# Patient Record
Sex: Female | Born: 1995
Health system: Southern US, Community
[De-identification: ages and names within clinical notes are randomized; demographics above are authoritative.]

---
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
## 2019-06-14 ENCOUNTER — Ambulatory Visit: Payer: BC Managed Care – PPO | Admitting: Women's Health

## 2019-06-14 ENCOUNTER — Encounter: Payer: Self-pay | Admitting: Women's Health

## 2019-06-14 ENCOUNTER — Other Ambulatory Visit: Payer: Self-pay

## 2019-06-14 VITALS — BP 110/78 | Ht 66.0 in | Wt 124.0 lb

## 2019-06-14 DIAGNOSIS — Z01419 Encounter for gynecological examination (general) (routine) without abnormal findings: Secondary | ICD-10-CM | POA: Diagnosis not present

## 2019-06-14 DIAGNOSIS — Z113 Encounter for screening for infections with a predominantly sexual mode of transmission: Secondary | ICD-10-CM | POA: Diagnosis not present

## 2019-06-14 MED ORDER — DROSPIRENONE-ETHINYL ESTRADIOL 3-0.02 MG PO TABS: 1.0000 | ORAL_TABLET | Freq: Every day | ORAL | 4 refills | Status: DC

## 2019-06-14 NOTE — Progress Notes (Signed)
Mallory Foster 02/16/1996 [id number]    History:    Presents for annual exam.  Monthly cycle on Yaz without complaint.  Currently not sexually active new partner in the past year.  Reports normal Pap history.  Thinks she has completed Gardasil unsure.  Past medical history, past surgical history, family history and social history were all reviewed and documented in the EPIC chart.  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  Father diabetes, mother epilepsy.  ROS:  A ROS was performed and pertinent positives and negatives are included.  Exam:  Vitals:   06/14/19 1618  BP: 110/78  Weight: 124 lb (56.2 kg)  Height: 5\' 6"  (1.676 m)   Body mass index is 20.01 kg/m.   General appearance:  Normal Thyroid:  Symmetrical, normal in size, without palpable masses or nodularity. Respiratory  Auscultation:  Clear without wheezing or rhonchi Cardiovascular  Auscultation:  Regular rate, without rubs, murmurs or gallops  Edema/varicosities:  Not grossly evident Abdominal  Soft,nontender, without masses, guarding or rebound.  Liver/spleen:  No organomegaly noted  Hernia:  None appreciated  Skin  Inspection:  Grossly normal   Breasts: Examined lying and sitting.     Right: Without masses, retractions, discharge or axillary adenopathy.     Left: Without masses, retractions, discharge or axillary adenopathy. Gentitourinary   Inguinal/mons:  Normal without inguinal adenopathy  External genitalia:  Normal  BUS/Urethra/Skene's glands:  Normal  Vagina:  Normal  Cervix:  Normal  Uterus:  normal in size, shape and contour.  Midline and mobile  Adnexa/parametria:     Rt: Without masses or tenderness.   Lt: Without masses or tenderness.  Anus and perineum: Normal    Assessment/Plan:  23 y.o. S WF G0 for annual exam with no complaints.  Monthly cycle on Yaz STD screen  Plan: Contraception management reviewed, contemplating Mirena IUD, information given, reviewed slight risk for  infection, perforation and hemorrhage reviewed.  Will check coverage and schedule appointment with Dr. Dellis Filbert to have placed with a cycle.  Refill of Yaz given, reviewed slight risk for blood clots and strokes.  Condoms encouraged until permanent partner.  SBEs, exercise, calcium rich foods, MVI daily encouraged.  CBC, GC/chlamydia, HIV, RPR, Pap. Will check with pediatrician if did not complete Gardasil series will call or return to office to complete series.    [place], 4:33 PM 06/14/2019

## 2019-06-14 NOTE — Patient Instructions (Addendum)
It was nice meeting you today!  Health Maintenance, Female Adopting a healthy lifestyle and getting preventive care are important in promoting health and wellness. Ask your health care provider about:  The right schedule for you to have regular tests and exams.  Things you can do on your own to prevent diseases and keep yourself healthy. What should I know about diet, weight, and exercise? Eat a healthy diet   Eat a diet that includes plenty of vegetables, fruits, low-fat dairy products, and lean protein.  Do not eat a lot of foods that are high in solid fats, added sugars, or sodium. Maintain a healthy weight Body mass index (BMI) is used to identify weight problems. It estimates body fat based on height and weight. Your health care provider can help determine your BMI and help you achieve or maintain a healthy weight. Get regular exercise Get regular exercise. This is one of the most important things you can do for your health. Most adults should:  Exercise for at least 150 minutes each week. The exercise should increase your heart rate and make you sweat (moderate-intensity exercise).  Do strengthening exercises at least twice a week. This is in addition to the moderate-intensity exercise.  Spend less time sitting. Even light physical activity can be beneficial. Watch cholesterol and blood lipids Have your blood tested for lipids and cholesterol at 24 years of age, then have this test every 5 years. Have your cholesterol levels checked more often if:  Your lipid or cholesterol levels are high.  You are older than 24 years of age.  You are at high risk for heart disease. What should I know about cancer screening? Depending on your health history and family history, you may need to have cancer screening at various ages. This may include screening for:  Breast cancer.  Cervical cancer.  Colorectal cancer.  Skin cancer.  Lung cancer. What should I know about heart  disease, diabetes, and high blood pressure? Blood pressure and heart disease  High blood pressure causes heart disease and increases the risk of stroke. This is more likely to develop in people who have high blood pressure readings, are of African descent, or are overweight.  Have your blood pressure checked: ? Every 3-5 years if you are 3318-24 years of age. ? Every year if you are 24 years old or older. Diabetes Have regular diabetes screenings. This checks your fasting blood sugar level. Have the screening done:  Once every three years after age 24 if you are at a normal weight and have a low risk for diabetes.  More often and at a younger age if you are overweight or have a high risk for diabetes. What should I know about preventing infection? Hepatitis B If you have a higher risk for hepatitis B, you should be screened for this virus. Talk with your health care provider to find out if you are at risk for hepatitis B infection. Hepatitis C Testing is recommended for:  Everyone born from [birth year] through 1966.  Anyone with known risk factors for hepatitis C. Sexually transmitted infections (STIs)  Get screened for STIs, including gonorrhea and chlamydia, if: ? You are sexually active and are younger than 24 years of age. ? You are older than 24 years of age and your health care provider tells you that you are at risk for this type of infection. ? Your sexual activity has changed since you were last screened, and you are at increased risk for chlamydia or  gonorrhea. Ask your health care provider if you are at risk.  Ask your health care provider about whether you are at high risk for HIV. Your health care provider may recommend a prescription medicine to help prevent HIV infection. If you choose to take medicine to prevent HIV, you should first get tested for HIV. You should then be tested every 3 months for as long as you are taking the medicine. Pregnancy  If you are about to stop  having your period (premenopausal) and you may become pregnant, seek counseling before you get pregnant.  Take 400 to 800 micrograms (mcg) of folic acid every day if you become pregnant.  Ask for birth control (contraception) if you want to prevent pregnancy. Osteoporosis and menopause Osteoporosis is a disease in which the bones lose minerals and strength with aging. This can result in bone fractures. If you are 29 years old or older, or if you are at risk for osteoporosis and fractures, ask your health care provider if you should:  Be screened for bone loss.  Take a calcium or vitamin D supplement to lower your risk of fractures.  Be given hormone replacement therapy (HRT) to treat symptoms of menopause. Follow these instructions at home: Lifestyle  Do not use any products that contain nicotine or tobacco, such as cigarettes, e-cigarettes, and chewing tobacco. If you need help quitting, ask your health care provider.  Do not use street drugs.  Do not share needles.  Ask your health care provider for help if you need support or information about quitting drugs. Alcohol use  Do not drink alcohol if: ? Your health care provider tells you not to drink. ? You are pregnant, may be pregnant, or are planning to become pregnant.  If you drink alcohol: ? Limit how much you use to 0-1 drink a day. ? Limit intake if you are breastfeeding.  Be aware of how much alcohol is in your drink. In the [place] one drink equals one 12 oz bottle of beer (355 mL), one 5 oz glass of wine (148 mL), or one 1 oz glass of hard liquor (44 mL). General instructions  Schedule regular health, dental, and eye exams.  Stay current with your vaccines.  Tell your health care provider if: ? You often feel depressed. ? You have ever been abused or do not feel safe at home. Summary  Adopting a healthy lifestyle and getting preventive care are important in promoting health and wellness.  Follow your health  care provider's instructions about healthy diet, exercising, and getting tested or screened for diseases.  Follow your health care provider's instructions on monitoring your cholesterol and blood pressure. This information is not intended to replace advice given to you by your health care provider. Make sure you discuss any questions you have with your health care provider. Document Released: 02/09/2011 Document Revised: 07/20/2018 Document Reviewed: 07/20/2018 Elsevier Patient Education  2020 [employer]. Levonorgestrel intrauterine device (IUD) What is this medicine? LEVONORGESTREL IUD (LEE voe nor jes trel) is a contraceptive (birth control) device. The device is placed inside the uterus by a healthcare professional. It is used to prevent pregnancy. This device can also be used to treat heavy bleeding that occurs during your period. This medicine may be used for other purposes; ask your health care provider or pharmacist if you have questions. COMMON BRAND NAME(S): Cameron Ali What should I tell my health care provider before I take this medicine? They need to know if you have  any of these conditions:  abnormal Pap smear  cancer of the breast, uterus, or cervix  diabetes  endometritis  genital or pelvic infection now or in the past  have more than one sexual partner or your partner has more than one partner  heart disease  history of an ectopic or tubal pregnancy  immune system problems  IUD in place  liver disease or tumor  problems with blood clots or take blood-thinners  seizures  use intravenous drugs  uterus of unusual shape  vaginal bleeding that has not been explained  an unusual or allergic reaction to levonorgestrel, other hormones, silicone, or polyethylene, medicines, foods, dyes, or preservatives  pregnant or trying to get pregnant  breast-feeding How should I use this medicine? This device is placed inside the uterus by a  health care professional. Talk to your pediatrician regarding the use of this medicine in children. Special care may be needed. Overdosage: If you think you have taken too much of this medicine contact a poison control center or emergency room at once. NOTE: This medicine is only for you. Do not share this medicine with others. What if I miss a dose? This does not apply. Depending on the brand of device you have inserted, the device will need to be replaced every 3 to 6 years if you wish to continue using this type of birth control. What may interact with this medicine? Do not take this medicine with any of the following medications:  amprenavir  bosentan  fosamprenavir This medicine may also interact with the following medications:  aprepitant  armodafinil  barbiturate medicines for inducing sleep or treating seizures  bexarotene  boceprevir  griseofulvin  medicines to treat seizures like carbamazepine, ethotoin, felbamate, oxcarbazepine, phenytoin, topiramate  modafinil  pioglitazone  rifabutin  rifampin  rifapentine  some medicines to treat HIV infection like atazanavir, efavirenz, indinavir, lopinavir, nelfinavir, tipranavir, ritonavir  St. John's wort  warfarin This list may not describe all possible interactions. Give your health care provider a list of all the medicines, herbs, non-prescription drugs, or dietary supplements you use. Also tell them if you smoke, drink alcohol, or use illegal drugs. Some items may interact with your medicine. What should I watch for while using this medicine? Visit your doctor or health care professional for regular check ups. See your doctor if you or your partner has sexual contact with others, becomes HIV positive, or gets a sexual transmitted disease. This product does not protect you against HIV infection (AIDS) or other sexually transmitted diseases. You can check the placement of the IUD yourself by reaching up to the top  of your vagina with clean fingers to feel the threads. Do not pull on the threads. It is a good habit to check placement after each menstrual period. Call your doctor right away if you feel more of the IUD than just the threads or if you cannot feel the threads at all. The IUD may come out by itself. You may become pregnant if the device comes out. If you notice that the IUD has come out use a backup birth control method like condoms and call your health care provider. Using tampons will not change the position of the IUD and are okay to use during your period. This IUD can be safely scanned with magnetic resonance imaging (MRI) only under specific conditions. Before you have an MRI, tell your healthcare provider that you have an IUD in place, and which type of IUD you have in place.  What side effects may I notice from receiving this medicine? Side effects that you should report to your doctor or health care professional as soon as possible:  allergic reactions like skin rash, itching or hives, swelling of the face, lips, or tongue  fever, flu-like symptoms  genital sores  high blood pressure  no menstrual period for 6 weeks during use  pain, swelling, warmth in the leg  pelvic pain or tenderness  severe or sudden headache  signs of pregnancy  stomach cramping  sudden shortness of breath  trouble with balance, talking, or walking  unusual vaginal bleeding, discharge  yellowing of the eyes or skin Side effects that usually do not require medical attention (report to your doctor or health care professional if they continue or are bothersome):  acne  breast pain  change in sex drive or performance  changes in weight  cramping, dizziness, or faintness while the device is being inserted  headache  irregular menstrual bleeding within first 3 to 6 months of use  nausea This list may not describe all possible side effects. Call your doctor for medical advice about side  effects. You may report side effects to FDA at 1-800-FDA-1088. Where should I keep my medicine? This does not apply. NOTE: This sheet is a summary. It may not cover all possible information. If you have questions about this medicine, talk to your doctor, pharmacist, or health care provider.  2020 Elsevier/Gold Standard (2018-06-07 13:22:01)

## 2019-06-15 LAB — CBC WITH DIFFERENTIAL/PLATELET
Absolute Monocytes: 694 cells/uL (ref 200–950)
Basophils Absolute: 101 cells/uL (ref 0–200)
Basophils Relative: 0.9 %
Eosinophils Absolute: 269 cells/uL (ref 15–500)
Eosinophils Relative: 2.4 %
HCT: 42.5 % (ref 35.0–45.0)
Hemoglobin: 14.1 g/dL (ref 11.7–15.5)
Lymphs Abs: 3853 cells/uL (ref 850–3900)
MCH: 29.2 pg (ref 27.0–33.0)
MCHC: 33.2 g/dL (ref 32.0–36.0)
MCV: 88 fL (ref 80.0–100.0)
MPV: 11.2 fL (ref 7.5–12.5)
Monocytes Relative: 6.2 %
Neutro Abs: 6283 cells/uL (ref 1500–7800)
Neutrophils Relative %: 56.1 %
Platelets: 343 10*3/uL (ref 140–400)
RBC: 4.83 10*6/uL (ref 3.80–5.10)
RDW: 11.6 % (ref 11.0–15.0)
Total Lymphocyte: 34.4 %
WBC: 11.2 10*3/uL — ABNORMAL HIGH (ref 3.8–10.8)

## 2019-06-15 LAB — HIV ANTIBODY (ROUTINE TESTING W REFLEX): HIV 1&2 Ab, 4th Generation: NONREACTIVE

## 2019-06-15 LAB — RPR: RPR Ser Ql: NONREACTIVE

## 2019-06-15 LAB — GLUCOSE, RANDOM: Glucose, Bld: 90 mg/dL (ref 65–99)

## 2019-06-16 LAB — PAP IG, CT-NG NAA, HPV HIGH-RISK
C. trachomatis RNA, TMA: NOT DETECTED
HPV DNA High Risk: NOT DETECTED
N. gonorrhoeae RNA, TMA: NOT DETECTED

## 2019-07-02 DIAGNOSIS — Z20828 Contact with and (suspected) exposure to other viral communicable diseases: Secondary | ICD-10-CM | POA: Diagnosis not present

## 2020-05-12 DIAGNOSIS — M791 Myalgia, unspecified site: Secondary | ICD-10-CM | POA: Diagnosis not present

## 2020-05-12 DIAGNOSIS — R6883 Chills (without fever): Secondary | ICD-10-CM | POA: Diagnosis not present

## 2020-05-12 DIAGNOSIS — R5383 Other fatigue: Secondary | ICD-10-CM | POA: Diagnosis not present

## 2020-07-17 ENCOUNTER — Other Ambulatory Visit: Payer: Self-pay

## 2020-07-22 ENCOUNTER — Telehealth: Payer: Self-pay

## 2020-07-22 ENCOUNTER — Other Ambulatory Visit: Payer: Self-pay

## 2020-07-22 MED ORDER — DROSPIRENONE-ETHINYL ESTRADIOL 3-0.02 MG PO TABS: 1.0000 | ORAL_TABLET | Freq: Every day | ORAL | 0 refills | Status: DC

## 2020-07-22 NOTE — Telephone Encounter (Signed)
Patient is out of OC and needs refill. She scheduled her CE with TW for 09/02/20.  Refill sent.

## 2020-09-02 ENCOUNTER — Other Ambulatory Visit: Payer: Self-pay

## 2020-09-02 ENCOUNTER — Encounter: Payer: Self-pay | Admitting: Nurse Practitioner

## 2020-09-02 ENCOUNTER — Ambulatory Visit (INDEPENDENT_AMBULATORY_CARE_PROVIDER_SITE_OTHER): Payer: BC Managed Care – PPO | Admitting: Nurse Practitioner

## 2020-09-02 VITALS — Ht 66.0 in | Wt 129.0 lb

## 2020-09-02 DIAGNOSIS — Z3041 Encounter for surveillance of contraceptive pills: Secondary | ICD-10-CM | POA: Diagnosis not present

## 2020-09-02 DIAGNOSIS — Z01419 Encounter for gynecological examination (general) (routine) without abnormal findings: Secondary | ICD-10-CM | POA: Diagnosis not present

## 2020-09-02 DIAGNOSIS — Z113 Encounter for screening for infections with a predominantly sexual mode of transmission: Secondary | ICD-10-CM | POA: Diagnosis not present

## 2020-09-02 MED ORDER — DROSPIRENONE-ETHINYL ESTRADIOL 3-0.02 MG PO TABS: 1.0000 | ORAL_TABLET | Freq: Every day | ORAL | 4 refills | Status: AC

## 2020-09-02 NOTE — Progress Notes (Signed)
   Mallory Foster 02-25-1996 [id number]   History:  25 y.o. G0 presents for annual exam without GYN complaints. Monthly cycle/OCPs. Normal pap history. Not sexually active for a couple of months.   Gynecologic History Patient's last menstrual period was 08/10/2020. Period Cycle (Days): 28 Period Duration (Days): 5 Period Pattern: Regular Menstrual Flow: Moderate Menstrual Control: Maxi pad,Tampon Dysmenorrhea: (!) Mild Dysmenorrhea Symptoms: Cramping Contraception/Family planning: OCP (estrogen/progesterone)  Health Maintenance Last Pap: 06/14/2019. Results were: Normal  Past medical history, past surgical history, family history and social history were all reviewed and documented in the EPIC chart. Charity fundraiser for [employer].  ROS:  A ROS was performed and pertinent positives and negatives are included.  Exam:  Vitals:   09/02/20 1532  Weight: 129 lb (58.5 kg)  Height: 5\' 6"  (1.676 m)   Body mass index is 20.82 kg/m.  General appearance:  Normal Thyroid:  Symmetrical, normal in size, without palpable masses or nodularity. Respiratory  Auscultation:  Clear without wheezing or rhonchi Cardiovascular  Auscultation:  Regular rate, without rubs, murmurs or gallops  Edema/varicosities:  Not grossly evident Abdominal  Soft,nontender, without masses, guarding or rebound.  Liver/spleen:  No organomegaly noted  Hernia:  None appreciated  Skin  Inspection:  Grossly normal   Breasts: Examined lying and sitting.   Right: Without masses, retractions, discharge or axillary adenopathy.   Left: Without masses, retractions, discharge or axillary adenopathy. Gentitourinary   Inguinal/mons:  Normal without inguinal adenopathy  External genitalia:  Normal  BUS/Urethra/Skene's glands:  Normal  Vagina:  Normal  Cervix:  Normal  Uterus:  Normal in size, shape and contour.  Midline and mobile  Adnexa/parametria:     Rt: Without masses or tenderness.   Lt: Without masses or  tenderness.  Anus and perineum: Normal  Assessment/Plan:  25 y.o. G0 for annual exam.   Well female exam with routine gynecological exam - Education provided on SBEs, importance of preventative screenings, current guidelines, high calcium diet, regular exercise, and multivitamin daily.   Encounter for surveillance of contraceptive pills - Plan: drospirenone-ethinyl estradiol (YAZ) 3-0.02 MG tablet daily. Taking as prescribed. Refill x 1 year provided.   Screen for STD (sexually transmitted disease) - Plan: C. trachomatis/N. gonorrhoeae RNA today. Last time she had blood drawn she passed out so we will not obtain HIV/RPR today. No new partner since last checked.   Screening for cervical cancer - normal pap history. Will repeat at 3-year interval per guidelines.   Follow up in 1 year for annual.     08-27-1996 [facility], 3:36 PM 09/02/2020

## 2020-09-02 NOTE — Patient Instructions (Signed)
Health Maintenance, Female Adopting a healthy lifestyle and getting preventive care are important in promoting health and wellness. Ask your health care provider about:  The right schedule for you to have regular tests and exams.  Things you can do on your own to prevent diseases and keep yourself healthy. What should I know about diet, weight, and exercise? Eat a healthy diet  Eat a diet that includes plenty of vegetables, fruits, low-fat dairy products, and lean protein.  Do not eat a lot of foods that are high in solid fats, added sugars, or sodium.   Maintain a healthy weight Body mass index (BMI) is used to identify weight problems. It estimates body fat based on height and weight. Your health care provider can help determine your BMI and help you achieve or maintain a healthy weight. Get regular exercise Get regular exercise. This is one of the most important things you can do for your health. Most adults should:  Exercise for at least 150 minutes each week. The exercise should increase your heart rate and make you sweat (moderate-intensity exercise).  Do strengthening exercises at least twice a week. This is in addition to the moderate-intensity exercise.  Spend less time sitting. Even light physical activity can be beneficial. Watch cholesterol and blood lipids Have your blood tested for lipids and cholesterol at 26 years of age, then have this test every 5 years. Have your cholesterol levels checked more often if:  Your lipid or cholesterol levels are high.  You are older than 26 years of age.  You are at high risk for heart disease. What should I know about cancer screening? Depending on your health history and family history, you may need to have cancer screening at various ages. This may include screening for:  Breast cancer.  Cervical cancer.  Colorectal cancer.  Skin cancer.  Lung cancer. What should I know about heart disease, diabetes, and high blood  pressure? Blood pressure and heart disease  High blood pressure causes heart disease and increases the risk of stroke. This is more likely to develop in people who have high blood pressure readings, are of African descent, or are overweight.  Have your blood pressure checked: ? Every 3-5 years if you are 18-39 years of age. ? Every year if you are 40 years old or older. Diabetes Have regular diabetes screenings. This checks your fasting blood sugar level. Have the screening done:  Once every three years after age 40 if you are at a normal weight and have a low risk for diabetes.  More often and at a younger age if you are overweight or have a high risk for diabetes. What should I know about preventing infection? Hepatitis B If you have a higher risk for hepatitis B, you should be screened for this virus. Talk with your health care provider to find out if you are at risk for hepatitis B infection. Hepatitis C Testing is recommended for:  Everyone born from 1945 through 1965.  Anyone with known risk factors for hepatitis C. Sexually transmitted infections (STIs)  Get screened for STIs, including gonorrhea and chlamydia, if: ? You are sexually active and are younger than 26 years of age. ? You are older than 26 years of age and your health care provider tells you that you are at risk for this type of infection. ? Your sexual activity has changed since you were last screened, and you are at increased risk for chlamydia or gonorrhea. Ask your health care provider   if you are at risk.  Ask your health care provider about whether you are at high risk for HIV. Your health care provider may recommend a prescription medicine to help prevent HIV infection. If you choose to take medicine to prevent HIV, you should first get tested for HIV. You should then be tested every 3 months for as long as you are taking the medicine. Pregnancy  If you are about to stop having your period (premenopausal) and  you may become pregnant, seek counseling before you get pregnant.  Take 400 to 800 micrograms (mcg) of folic acid every day if you become pregnant.  Ask for birth control (contraception) if you want to prevent pregnancy. Osteoporosis and menopause Osteoporosis is a disease in which the bones lose minerals and strength with aging. This can result in bone fractures. If you are 65 years old or older, or if you are at risk for osteoporosis and fractures, ask your health care provider if you should:  Be screened for bone loss.  Take a calcium or vitamin D supplement to lower your risk of fractures.  Be given hormone replacement therapy (HRT) to treat symptoms of menopause. Follow these instructions at home: Lifestyle  Do not use any products that contain nicotine or tobacco, such as cigarettes, e-cigarettes, and chewing tobacco. If you need help quitting, ask your health care provider.  Do not use street drugs.  Do not share needles.  Ask your health care provider for help if you need support or information about quitting drugs. Alcohol use  Do not drink alcohol if: ? Your health care provider tells you not to drink. ? You are pregnant, may be pregnant, or are planning to become pregnant.  If you drink alcohol: ? Limit how much you use to 0-1 drink a day. ? Limit intake if you are breastfeeding.  Be aware of how much alcohol is in your drink. In the [place] one drink equals one 12 oz bottle of beer (355 mL), one 5 oz glass of wine (148 mL), or one 1 oz glass of hard liquor (44 mL). General instructions  Schedule regular health, dental, and eye exams.  Stay current with your vaccines.  Tell your health care provider if: ? You often feel depressed. ? You have ever been abused or do not feel safe at home. Summary  Adopting a healthy lifestyle and getting preventive care are important in promoting health and wellness.  Follow your health care provider's instructions about healthy  diet, exercising, and getting tested or screened for diseases.  Follow your health care provider's instructions on monitoring your cholesterol and blood pressure. This information is not intended to replace advice given to you by your health care provider. Make sure you discuss any questions you have with your health care provider. Document Revised: 07/20/2018 Document Reviewed: 07/20/2018 [employer] Patient Education  2021 [employer]  

## 2020-09-03 LAB — C. TRACHOMATIS/N. GONORRHOEAE RNA
C. trachomatis RNA, TMA: NOT DETECTED
N. gonorrhoeae RNA, TMA: NOT DETECTED

## 2021-07-06 DIAGNOSIS — Y999 Unspecified external cause status: Secondary | ICD-10-CM | POA: Diagnosis not present

## 2021-07-06 DIAGNOSIS — S61211A Laceration without foreign body of left index finger without damage to nail, initial encounter: Secondary | ICD-10-CM | POA: Diagnosis not present

## 2021-07-06 DIAGNOSIS — W260XXA Contact with knife, initial encounter: Secondary | ICD-10-CM | POA: Diagnosis not present

## 2021-07-10 DIAGNOSIS — S66111A Strain of flexor muscle, fascia and tendon of left index finger at wrist and hand level, initial encounter: Secondary | ICD-10-CM | POA: Diagnosis not present

## 2021-07-16 DIAGNOSIS — S66121A Laceration of flexor muscle, fascia and tendon of left index finger at wrist and hand level, initial encounter: Secondary | ICD-10-CM | POA: Diagnosis not present

## 2021-07-16 DIAGNOSIS — S64491A Injury of digital nerve of left index finger, initial encounter: Secondary | ICD-10-CM | POA: Diagnosis not present

## 2021-07-16 DIAGNOSIS — S66111A Strain of flexor muscle, fascia and tendon of left index finger at wrist and hand level, initial encounter: Secondary | ICD-10-CM | POA: Diagnosis not present

## 2021-07-18 DIAGNOSIS — X58XXXD Exposure to other specified factors, subsequent encounter: Secondary | ICD-10-CM | POA: Diagnosis not present

## 2021-07-18 DIAGNOSIS — S66812D Strain of other specified muscles, fascia and tendons at wrist and hand level, left hand, subsequent encounter: Secondary | ICD-10-CM | POA: Diagnosis not present

## 2021-07-18 DIAGNOSIS — Z7409 Other reduced mobility: Secondary | ICD-10-CM | POA: Diagnosis not present

## 2021-07-18 DIAGNOSIS — Z789 Other specified health status: Secondary | ICD-10-CM | POA: Diagnosis not present

## 2021-07-18 DIAGNOSIS — S66812A Strain of other specified muscles, fascia and tendons at wrist and hand level, left hand, initial encounter: Secondary | ICD-10-CM | POA: Diagnosis not present

## 2021-07-23 DIAGNOSIS — S66812A Strain of other specified muscles, fascia and tendons at wrist and hand level, left hand, initial encounter: Secondary | ICD-10-CM | POA: Diagnosis not present

## 2021-07-23 DIAGNOSIS — S66812D Strain of other specified muscles, fascia and tendons at wrist and hand level, left hand, subsequent encounter: Secondary | ICD-10-CM | POA: Diagnosis not present

## 2021-07-23 DIAGNOSIS — Z7409 Other reduced mobility: Secondary | ICD-10-CM | POA: Diagnosis not present

## 2021-07-23 DIAGNOSIS — X58XXXD Exposure to other specified factors, subsequent encounter: Secondary | ICD-10-CM | POA: Diagnosis not present

## 2021-07-23 DIAGNOSIS — Z789 Other specified health status: Secondary | ICD-10-CM | POA: Diagnosis not present

## 2021-08-01 DIAGNOSIS — S66812A Strain of other specified muscles, fascia and tendons at wrist and hand level, left hand, initial encounter: Secondary | ICD-10-CM | POA: Diagnosis not present

## 2021-08-01 DIAGNOSIS — Z7409 Other reduced mobility: Secondary | ICD-10-CM | POA: Diagnosis not present

## 2021-08-01 DIAGNOSIS — Z789 Other specified health status: Secondary | ICD-10-CM | POA: Diagnosis not present

## 2021-08-01 DIAGNOSIS — X58XXXD Exposure to other specified factors, subsequent encounter: Secondary | ICD-10-CM | POA: Diagnosis not present

## 2021-08-01 DIAGNOSIS — S66812D Strain of other specified muscles, fascia and tendons at wrist and hand level, left hand, subsequent encounter: Secondary | ICD-10-CM | POA: Diagnosis not present

## 2021-08-07 DIAGNOSIS — S66812A Strain of other specified muscles, fascia and tendons at wrist and hand level, left hand, initial encounter: Secondary | ICD-10-CM | POA: Diagnosis not present

## 2021-08-07 DIAGNOSIS — X58XXXD Exposure to other specified factors, subsequent encounter: Secondary | ICD-10-CM | POA: Diagnosis not present

## 2021-08-07 DIAGNOSIS — Z7409 Other reduced mobility: Secondary | ICD-10-CM | POA: Diagnosis not present

## 2021-08-07 DIAGNOSIS — S66812D Strain of other specified muscles, fascia and tendons at wrist and hand level, left hand, subsequent encounter: Secondary | ICD-10-CM | POA: Diagnosis not present

## 2021-08-07 DIAGNOSIS — Z789 Other specified health status: Secondary | ICD-10-CM | POA: Diagnosis not present

## 2021-08-14 DIAGNOSIS — S66812A Strain of other specified muscles, fascia and tendons at wrist and hand level, left hand, initial encounter: Secondary | ICD-10-CM | POA: Diagnosis not present

## 2021-08-14 DIAGNOSIS — Z789 Other specified health status: Secondary | ICD-10-CM | POA: Diagnosis not present

## 2021-08-14 DIAGNOSIS — X58XXXD Exposure to other specified factors, subsequent encounter: Secondary | ICD-10-CM | POA: Diagnosis not present

## 2021-08-14 DIAGNOSIS — Z7409 Other reduced mobility: Secondary | ICD-10-CM | POA: Diagnosis not present

## 2021-08-18 DIAGNOSIS — X58XXXD Exposure to other specified factors, subsequent encounter: Secondary | ICD-10-CM | POA: Diagnosis not present

## 2021-08-18 DIAGNOSIS — S66812A Strain of other specified muscles, fascia and tendons at wrist and hand level, left hand, initial encounter: Secondary | ICD-10-CM | POA: Diagnosis not present

## 2021-08-18 DIAGNOSIS — Z789 Other specified health status: Secondary | ICD-10-CM | POA: Diagnosis not present

## 2021-08-18 DIAGNOSIS — Z7409 Other reduced mobility: Secondary | ICD-10-CM | POA: Diagnosis not present

## 2021-08-21 DIAGNOSIS — Z7409 Other reduced mobility: Secondary | ICD-10-CM | POA: Diagnosis not present

## 2021-08-21 DIAGNOSIS — X58XXXD Exposure to other specified factors, subsequent encounter: Secondary | ICD-10-CM | POA: Diagnosis not present

## 2021-08-21 DIAGNOSIS — Z789 Other specified health status: Secondary | ICD-10-CM | POA: Diagnosis not present

## 2021-08-21 DIAGNOSIS — S66812A Strain of other specified muscles, fascia and tendons at wrist and hand level, left hand, initial encounter: Secondary | ICD-10-CM | POA: Diagnosis not present

## 2021-08-28 DIAGNOSIS — Z7409 Other reduced mobility: Secondary | ICD-10-CM | POA: Diagnosis not present

## 2021-08-28 DIAGNOSIS — S66812A Strain of other specified muscles, fascia and tendons at wrist and hand level, left hand, initial encounter: Secondary | ICD-10-CM | POA: Diagnosis not present

## 2021-08-28 DIAGNOSIS — X58XXXD Exposure to other specified factors, subsequent encounter: Secondary | ICD-10-CM | POA: Diagnosis not present

## 2021-08-28 DIAGNOSIS — Z789 Other specified health status: Secondary | ICD-10-CM | POA: Diagnosis not present

## 2021-09-03 ENCOUNTER — Ambulatory Visit: Payer: BC Managed Care – PPO | Admitting: Nurse Practitioner

## 2021-09-05 DIAGNOSIS — S66812A Strain of other specified muscles, fascia and tendons at wrist and hand level, left hand, initial encounter: Secondary | ICD-10-CM | POA: Diagnosis not present

## 2021-09-05 DIAGNOSIS — X58XXXD Exposure to other specified factors, subsequent encounter: Secondary | ICD-10-CM | POA: Diagnosis not present

## 2021-09-05 DIAGNOSIS — Z7409 Other reduced mobility: Secondary | ICD-10-CM | POA: Diagnosis not present

## 2021-09-05 DIAGNOSIS — Z789 Other specified health status: Secondary | ICD-10-CM | POA: Diagnosis not present

## 2021-09-12 DIAGNOSIS — Z7409 Other reduced mobility: Secondary | ICD-10-CM | POA: Diagnosis not present

## 2021-09-12 DIAGNOSIS — X58XXXD Exposure to other specified factors, subsequent encounter: Secondary | ICD-10-CM | POA: Diagnosis not present

## 2021-09-12 DIAGNOSIS — S66812A Strain of other specified muscles, fascia and tendons at wrist and hand level, left hand, initial encounter: Secondary | ICD-10-CM | POA: Diagnosis not present

## 2021-09-12 DIAGNOSIS — Z789 Other specified health status: Secondary | ICD-10-CM | POA: Diagnosis not present

## 2021-09-13 ENCOUNTER — Other Ambulatory Visit: Payer: Self-pay | Admitting: Nurse Practitioner

## 2021-09-13 DIAGNOSIS — Z3041 Encounter for surveillance of contraceptive pills: Secondary | ICD-10-CM

## 2021-09-15 NOTE — Telephone Encounter (Addendum)
Left message for patient to call.  Rx was [facility] to [facility] [place], [facility] asking for refill.

## 2021-09-18 DIAGNOSIS — Z789 Other specified health status: Secondary | ICD-10-CM | POA: Diagnosis not present

## 2021-09-18 DIAGNOSIS — X58XXXD Exposure to other specified factors, subsequent encounter: Secondary | ICD-10-CM | POA: Diagnosis not present

## 2021-09-18 DIAGNOSIS — Z7409 Other reduced mobility: Secondary | ICD-10-CM | POA: Diagnosis not present

## 2021-09-18 DIAGNOSIS — S66812A Strain of other specified muscles, fascia and tendons at wrist and hand level, left hand, initial encounter: Secondary | ICD-10-CM | POA: Diagnosis not present

## 2021-09-25 DIAGNOSIS — Z7409 Other reduced mobility: Secondary | ICD-10-CM | POA: Diagnosis not present

## 2021-09-25 DIAGNOSIS — Z789 Other specified health status: Secondary | ICD-10-CM | POA: Diagnosis not present

## 2021-09-25 DIAGNOSIS — S66812A Strain of other specified muscles, fascia and tendons at wrist and hand level, left hand, initial encounter: Secondary | ICD-10-CM | POA: Diagnosis not present

## 2021-09-25 DIAGNOSIS — X58XXXD Exposure to other specified factors, subsequent encounter: Secondary | ICD-10-CM | POA: Diagnosis not present

## 2021-10-03 DIAGNOSIS — S66812A Strain of other specified muscles, fascia and tendons at wrist and hand level, left hand, initial encounter: Secondary | ICD-10-CM | POA: Diagnosis not present

## 2021-10-03 DIAGNOSIS — Z789 Other specified health status: Secondary | ICD-10-CM | POA: Diagnosis not present

## 2021-10-03 DIAGNOSIS — X58XXXD Exposure to other specified factors, subsequent encounter: Secondary | ICD-10-CM | POA: Diagnosis not present

## 2021-10-03 DIAGNOSIS — Z7409 Other reduced mobility: Secondary | ICD-10-CM | POA: Diagnosis not present

## 2021-10-09 DIAGNOSIS — Z7409 Other reduced mobility: Secondary | ICD-10-CM | POA: Diagnosis not present

## 2021-10-09 DIAGNOSIS — X58XXXD Exposure to other specified factors, subsequent encounter: Secondary | ICD-10-CM | POA: Diagnosis not present

## 2021-10-09 DIAGNOSIS — S66812A Strain of other specified muscles, fascia and tendons at wrist and hand level, left hand, initial encounter: Secondary | ICD-10-CM | POA: Diagnosis not present

## 2021-10-09 DIAGNOSIS — Z789 Other specified health status: Secondary | ICD-10-CM | POA: Diagnosis not present

## 2022-04-15 DIAGNOSIS — Z1159 Encounter for screening for other viral diseases: Secondary | ICD-10-CM | POA: Diagnosis not present

## 2022-04-15 DIAGNOSIS — Z01419 Encounter for gynecological examination (general) (routine) without abnormal findings: Secondary | ICD-10-CM | POA: Diagnosis not present

## 2022-04-15 DIAGNOSIS — Z114 Encounter for screening for human immunodeficiency virus [HIV]: Secondary | ICD-10-CM | POA: Diagnosis not present

## 2022-04-15 DIAGNOSIS — Z01411 Encounter for gynecological examination (general) (routine) with abnormal findings: Secondary | ICD-10-CM | POA: Diagnosis not present

## 2022-04-15 DIAGNOSIS — N926 Irregular menstruation, unspecified: Secondary | ICD-10-CM | POA: Diagnosis not present

## 2022-04-15 DIAGNOSIS — Z113 Encounter for screening for infections with a predominantly sexual mode of transmission: Secondary | ICD-10-CM | POA: Diagnosis not present

## 2022-04-15 DIAGNOSIS — Z1151 Encounter for screening for human papillomavirus (HPV): Secondary | ICD-10-CM | POA: Diagnosis not present

## 2022-04-15 DIAGNOSIS — Z118 Encounter for screening for other infectious and parasitic diseases: Secondary | ICD-10-CM | POA: Diagnosis not present

## 2022-04-15 DIAGNOSIS — Z833 Family history of diabetes mellitus: Secondary | ICD-10-CM | POA: Diagnosis not present

## 2022-04-23 DIAGNOSIS — N926 Irregular menstruation, unspecified: Secondary | ICD-10-CM | POA: Diagnosis not present
